# Patient Record
Sex: Female | Born: 1965 | Race: White | Hispanic: No | Marital: Married | Smoking: Current every day smoker
Health system: Southern US, Community
[De-identification: ages and names within clinical notes are randomized; demographics above are authoritative.]

## PROBLEM LIST (undated history)

## (undated) DIAGNOSIS — F419 Anxiety disorder, unspecified: Secondary | ICD-10-CM

## (undated) DIAGNOSIS — S4290XA Fracture of unspecified shoulder girdle, part unspecified, initial encounter for closed fracture: Secondary | ICD-10-CM

## (undated) DIAGNOSIS — Z8619 Personal history of other infectious and parasitic diseases: Secondary | ICD-10-CM

## (undated) HISTORY — DX: Anxiety disorder, unspecified: F41.9

## (undated) HISTORY — DX: Fracture of unspecified shoulder girdle, part unspecified, initial encounter for closed fracture: S42.90XA

## (undated) HISTORY — DX: Personal history of other infectious and parasitic diseases: Z86.19

---

## 1988-04-10 HISTORY — PX: DILATION AND CURETTAGE OF UTERUS: SHX78

## 1998-11-18 ENCOUNTER — Ambulatory Visit (HOSPITAL_COMMUNITY): Admission: RE | Admit: 1998-11-18 | Discharge: 1998-11-18 | Payer: Self-pay

## 1998-12-14 ENCOUNTER — Emergency Department (HOSPITAL_COMMUNITY): Admission: EM | Admit: 1998-12-14 | Discharge: 1998-12-14 | Payer: Self-pay | Admitting: Emergency Medicine

## 1999-01-12 ENCOUNTER — Encounter: Admission: RE | Admit: 1999-01-12 | Discharge: 1999-04-12 | Payer: Self-pay | Admitting: Specialist

## 1999-03-31 ENCOUNTER — Ambulatory Visit (HOSPITAL_COMMUNITY): Admission: RE | Admit: 1999-03-31 | Discharge: 1999-03-31 | Payer: Self-pay | Admitting: Specialist

## 1999-03-31 ENCOUNTER — Encounter: Payer: Self-pay | Admitting: Specialist

## 1999-05-30 ENCOUNTER — Ambulatory Visit (HOSPITAL_COMMUNITY): Admission: RE | Admit: 1999-05-30 | Discharge: 1999-05-30 | Payer: Self-pay | Admitting: Specialist

## 1999-05-30 ENCOUNTER — Encounter: Payer: Self-pay | Admitting: Specialist

## 1999-08-09 ENCOUNTER — Ambulatory Visit (HOSPITAL_COMMUNITY): Admission: RE | Admit: 1999-08-09 | Discharge: 1999-08-09 | Payer: Self-pay | Admitting: Specialist

## 1999-08-09 ENCOUNTER — Encounter: Payer: Self-pay | Admitting: Specialist

## 2001-01-21 ENCOUNTER — Ambulatory Visit (HOSPITAL_COMMUNITY): Admission: RE | Admit: 2001-01-21 | Discharge: 2001-01-21 | Payer: Self-pay | Admitting: Specialist

## 2001-01-21 ENCOUNTER — Encounter: Payer: Self-pay | Admitting: Specialist

## 2001-02-01 ENCOUNTER — Encounter: Payer: Self-pay | Admitting: Specialist

## 2001-02-01 ENCOUNTER — Encounter: Admission: RE | Admit: 2001-02-01 | Discharge: 2001-02-01 | Payer: Self-pay | Admitting: Specialist

## 2001-04-10 DIAGNOSIS — S4290XA Fracture of unspecified shoulder girdle, part unspecified, initial encounter for closed fracture: Secondary | ICD-10-CM

## 2001-04-10 HISTORY — DX: Fracture of unspecified shoulder girdle, part unspecified, initial encounter for closed fracture: S42.90XA

## 2002-02-25 ENCOUNTER — Other Ambulatory Visit: Admission: RE | Admit: 2002-02-25 | Discharge: 2002-02-25 | Payer: Self-pay | Admitting: *Deleted

## 2003-05-26 ENCOUNTER — Emergency Department (HOSPITAL_COMMUNITY): Admission: EM | Admit: 2003-05-26 | Discharge: 2003-05-26 | Payer: Self-pay | Admitting: Family Medicine

## 2009-07-20 ENCOUNTER — Emergency Department (HOSPITAL_COMMUNITY): Admission: EM | Admit: 2009-07-20 | Discharge: 2009-07-20 | Payer: Self-pay | Admitting: Family Medicine

## 2011-11-02 ENCOUNTER — Other Ambulatory Visit: Payer: Self-pay | Admitting: Family

## 2011-11-02 ENCOUNTER — Encounter: Payer: Self-pay | Admitting: Family

## 2011-11-02 ENCOUNTER — Ambulatory Visit (INDEPENDENT_AMBULATORY_CARE_PROVIDER_SITE_OTHER): Payer: BC Managed Care – PPO | Admitting: Family

## 2011-11-02 ENCOUNTER — Other Ambulatory Visit (HOSPITAL_COMMUNITY)
Admission: RE | Admit: 2011-11-02 | Discharge: 2011-11-02 | Disposition: A | Discharge status: Home / Self Care | Payer: BC Managed Care – PPO | Source: Ambulatory Visit | Attending: Family | Admitting: Family

## 2011-11-02 VITALS — BP 136/86 | HR 110 | Temp 98.0°F | Resp 16 | Ht 65.0 in | Wt 137.1 lb

## 2011-11-02 DIAGNOSIS — F17200 Nicotine dependence, unspecified, uncomplicated: Secondary | ICD-10-CM

## 2011-11-02 DIAGNOSIS — Z01419 Encounter for gynecological examination (general) (routine) without abnormal findings: Secondary | ICD-10-CM | POA: Insufficient documentation

## 2011-11-02 DIAGNOSIS — Z87898 Personal history of other specified conditions: Secondary | ICD-10-CM

## 2011-11-02 DIAGNOSIS — Z720 Tobacco use: Secondary | ICD-10-CM

## 2011-11-02 DIAGNOSIS — Z0000 Encounter for general adult medical examination without abnormal findings: Secondary | ICD-10-CM

## 2011-11-02 DIAGNOSIS — Z1231 Encounter for screening mammogram for malignant neoplasm of breast: Secondary | ICD-10-CM

## 2011-11-02 LAB — CBC WITH DIFFERENTIAL/PLATELET
Basophils Relative: 0 % (ref 0–1)
Eosinophils Absolute: 0.1 10*3/uL (ref 0.0–0.7)
Eosinophils Relative: 1 % (ref 0–5)
Lymphs Abs: 1.9 10*3/uL (ref 0.7–4.0)
MCH: 34.2 pg — ABNORMAL HIGH (ref 26.0–34.0)
MCHC: 35.7 g/dL (ref 30.0–36.0)
MCV: 95.8 fL (ref 78.0–100.0)
Neutrophils Relative %: 66 % (ref 43–77)
Platelets: 269 10*3/uL (ref 150–400)

## 2011-11-02 LAB — LIPID PANEL
Cholesterol: 224 mg/dL — ABNORMAL HIGH (ref 0–200)
HDL: 81 mg/dL (ref >39)
Total CHOL/HDL Ratio: 2.8 Ratio

## 2011-11-02 LAB — HEPATIC FUNCTION PANEL
AST: 17 U/L (ref 0–37)
Albumin: 4.6 g/dL (ref 3.5–5.2)
Alkaline Phosphatase: 67 U/L (ref 39–117)
Total Bilirubin: 0.4 mg/dL (ref 0.3–1.2)
Total Protein: 7.3 g/dL (ref 6.0–8.3)

## 2011-11-02 MED ORDER — VARENICLINE TARTRATE 0.5 MG X 11 & 1 MG X 42 PO MISC: ORAL | Status: AC

## 2011-11-02 NOTE — Patient Instructions (Addendum)
Please complete your lab work prior to leaving. We will mail you the results of your lab work and pap smear. Schedule mammogram on the first floor. Good luck quitting smoking! Welcome to [facility]!

## 2011-11-02 NOTE — Progress Notes (Signed)
Subjective:    Patient ID: Kimberly Mcneil, female    DOB: June 03, 1965, 46 y.o.   MRN: [id number]  HPI  Preventative- last pap was 10-11 yrs ago.  Mammogram- never.  Pt is fasting today.  She reports that she is active in her job but no formal exercise.   Tobacco abuse- smoked x 30 yrs.  She has never tried to quit.   Heel spur, fatigue.  Has been taking caffeine.  Works 12 hrs a day.  Vision is worse with driving reading.   Review of Systems  Constitutional: Negative for unexpected weight change.  HENT: Negative for hearing loss and congestion.   Eyes:       Notes some blurred vision.  Respiratory:       Smokers cough  Cardiovascular:       Notes occasional chest pain with anxiety- resolves on own.  Gastrointestinal: Negative for nausea, vomiting and diarrhea.  Genitourinary: Negative for dysuria, frequency and menstrual problem.  Musculoskeletal: Positive for arthralgias. Negative for myalgias.       Knees, elbows  Neurological: Positive for headaches.       Reports headaches 2-3 times a day  Psychiatric/Behavioral:       Denies depression   Past Medical History  Diagnosis Date  . History of chicken pox   . Broken shoulder 2003    left. Due to injury at work    History   Social History  . Marital Status: Married    Spouse Name: N/A    Number of Children: 0  . Years of Education: N/A   Occupational History  .  Ups   Social History Main Topics  . Smoking status: Current Everyday Smoker -- 1.5 packs/day for 30 years    Types: Cigarettes  . Smokeless tobacco: Never Used  . Alcohol Use: 6.0 oz/week    10 Cans of beer per week  . Drug Use: Not on file  . Sexually Active: Not on file   Other Topics Concern  . Not on file   Social History Narrative   Regular exercise:  Active work [employer] use:  2-3 sodas Lobbyist deliveryCompleted 12th gradeNo children4 dogsEnjoys beach/mountains, reading    Past Surgical History  Procedure Date  . Dilation  and curettage of uterus 1990    Family History  Problem Relation Age of Onset  . Hypertension Mother   . Dementia Mother     started in her 67's, died at age 73  . Diabetes Mother   . Cancer Father     malignant melanoma- 58  . Cancer Maternal Grandmother     breast cancer- age unknown  . Arthritis Maternal Grandfather   . Heart disease Paternal Grandfather     No Known Allergies  Current Outpatient Prescriptions on File Prior to Visit  Medication Sig Dispense Refill  . Calcium Carbonate-Vitamin D (CALCIUM-D) 600-400 MG-UNIT TABS Take 1 tablet by mouth 2 (two) times daily.        BP 136/86  Pulse 110  Temp 98 F (36.7 C) (Oral)  Resp 16  Ht 5\' 5"  (1.651 m)  Wt 137 lb 1.9 oz (62.197 kg)  BMI 22.82 kg/m2  SpO2 99%  LMP 10/26/2011       Objective:   Physical Exam Physical Exam  Constitutional: She is oriented to person, place, and time. She appears well-developed and well-nourished. No distress.  HENT:  Head: Normocephalic and atraumatic.  Right Ear: Tympanic membrane and ear canal normal.  Left Ear:  Tympanic membrane and ear canal normal.  Mouth/Throat: Oropharynx is clear and moist.  Eyes: Pupils are equal, round, and reactive to light. No scleral icterus.  Neck: Normal range of motion. No thyromegaly present.  Cardiovascular: Normal rate and regular rhythm.   No murmur heard. Pulmonary/Chest: Effort normal and breath sounds normal. No respiratory distress. He has no wheezes. She has no rales. She exhibits no tenderness.  Abdominal: Soft. Bowel sounds are normal. He exhibits no distension and no mass. There is no tenderness. There is no rebound and no guarding.  Musculoskeletal: She exhibits no edema.  Lymphadenopathy:    She has no cervical adenopathy.  Neurological: She is alert and oriented to person, place, and time. She has normal reflexes. She exhibits normal muscle tone. Coordination normal.  Skin: Skin is warm and dry.  Psychiatric: She has a normal  mood and affect. Her behavior is normal. Judgment and thought content normal.  Breasts: Examined lying Right: Without masses, retractions, discharge or axillary adenopathy.  Left: Without masses, retractions, discharge or axillary adenopathy.  Inguinal/mons: Normal without inguinal adenopathy  External genitalia: Normal  BUS/Urethra/Skene's glands: Normal  Bladder: Normal  Vagina: Normal  Cervix: Normal (pap performed with chaperone) Uterus: normal in size, shape and contour. Midline and mobile  Adnexa/parametria:  Rt: Without masses or tenderness.  Lt: Without masses or tenderness.  Anus and perineum: Normal           Assessment & Plan:          Assessment & Plan:

## 2011-11-03 LAB — BASIC METABOLIC PANEL WITH GFR
BUN: 15 mg/dL (ref 6–23)
CO2: 27 mEq/L (ref 19–32)
Calcium: 10.5 mg/dL (ref 8.4–10.5)
Chloride: 105 mEq/L (ref 96–112)
Creat: 0.62 mg/dL (ref 0.50–1.10)
GFR, Est African American: >89 mL/min
GFR, Est Non African American: >89 mL/min
Glucose, Bld: 94 mg/dL (ref 70–99)
Potassium: 4.7 mEq/L (ref 3.5–5.3)
Sodium: 139 mEq/L (ref 135–145)

## 2011-11-03 LAB — URINALYSIS, ROUTINE W REFLEX MICROSCOPIC
Glucose, UA: NEGATIVE mg/dL
Leukocytes, UA: NEGATIVE
Nitrite: NEGATIVE
Specific Gravity, Urine: 1.021 (ref 1.005–1.030)
pH: 5.5 (ref 5.0–8.0)

## 2011-11-03 LAB — URINALYSIS, MICROSCOPIC ONLY
Casts: NONE SEEN
Crystals: NONE SEEN

## 2011-11-03 LAB — TSH: TSH: 2.305 u[IU]/mL (ref 0.350–4.500)

## 2011-11-08 ENCOUNTER — Telehealth: Payer: Self-pay | Admitting: Family

## 2011-11-08 DIAGNOSIS — Z0000 Encounter for general adult medical examination without abnormal findings: Secondary | ICD-10-CM | POA: Insufficient documentation

## 2011-11-08 DIAGNOSIS — Z720 Tobacco use: Secondary | ICD-10-CM | POA: Insufficient documentation

## 2011-11-08 NOTE — Telephone Encounter (Signed)
Pls let pt know that her pap smear looks ok, but we did not get a good sampling of the cells from her cervix.  I would like to repeat her pap in 6 months. Thyroid and liver look good.  Looks like she may have been a little dehydrated that day.  Make sure she is drinking 6-8 glasses of water a day.  Cholesterol is mildly elevated. She should work on a low fat/low cholesterol diet and exercise.

## 2011-11-08 NOTE — Assessment & Plan Note (Signed)
Pt counseled on risks of smoking and on smoking cessation x 5 minutes.  She would like to start chantix.  We discussed common side effects and risks of chantix including black box warning of suicide ideation.  Pt verbalizes understanding.

## 2011-11-08 NOTE — Assessment & Plan Note (Addendum)
Pt counseled on healthy diet, exercise.  Pap performed today, refer for mammogram. Immunizations reviewed and up to date.

## 2011-11-09 NOTE — Telephone Encounter (Signed)
Notified pt of results below. Pt states she will wait to repeat pap smear in 1 year. Advised pt that recommendation is to repeat in 6 months as result is not definitive from the cervix. Pt voices understanding but still wishes to repeat in 1 year.

## 2011-11-29 ENCOUNTER — Ambulatory Visit (HOSPITAL_BASED_OUTPATIENT_CLINIC_OR_DEPARTMENT_OTHER)
Admission: RE | Admit: 2011-11-29 | Discharge: 2011-11-29 | Disposition: A | Discharge status: Home / Self Care | Payer: BC Managed Care – PPO | Source: Ambulatory Visit | Attending: Family | Admitting: Family

## 2011-11-29 DIAGNOSIS — Z1231 Encounter for screening mammogram for malignant neoplasm of breast: Secondary | ICD-10-CM | POA: Insufficient documentation

## 2011-12-04 ENCOUNTER — Ambulatory Visit (HOSPITAL_BASED_OUTPATIENT_CLINIC_OR_DEPARTMENT_OTHER): Payer: BC Managed Care – PPO

## 2011-12-04 ENCOUNTER — Inpatient Hospital Stay (HOSPITAL_BASED_OUTPATIENT_CLINIC_OR_DEPARTMENT_OTHER): Admission: RE | Admit: 2011-12-04 | Payer: BC Managed Care – PPO | Source: Ambulatory Visit

## 2012-03-02 ENCOUNTER — Encounter (HOSPITAL_COMMUNITY): Payer: Self-pay | Admitting: Emergency Medicine

## 2012-03-02 ENCOUNTER — Emergency Department (HOSPITAL_COMMUNITY)
Admission: EM | Admit: 2012-03-02 | Discharge: 2012-03-02 | Disposition: A | Discharge status: Home / Self Care | Payer: BC Managed Care – PPO | Attending: Emergency Medicine | Admitting: Emergency Medicine

## 2012-03-02 DIAGNOSIS — Z87828 Personal history of other (healed) physical injury and trauma: Secondary | ICD-10-CM | POA: Insufficient documentation

## 2012-03-02 DIAGNOSIS — S058X9A Other injuries of unspecified eye and orbit, initial encounter: Secondary | ICD-10-CM | POA: Insufficient documentation

## 2012-03-02 DIAGNOSIS — H539 Unspecified visual disturbance: Secondary | ICD-10-CM | POA: Insufficient documentation

## 2012-03-02 DIAGNOSIS — H5789 Other specified disorders of eye and adnexa: Secondary | ICD-10-CM | POA: Insufficient documentation

## 2012-03-02 DIAGNOSIS — Z8619 Personal history of other infectious and parasitic diseases: Secondary | ICD-10-CM | POA: Insufficient documentation

## 2012-03-02 DIAGNOSIS — F17200 Nicotine dependence, unspecified, uncomplicated: Secondary | ICD-10-CM | POA: Insufficient documentation

## 2012-03-02 DIAGNOSIS — Y929 Unspecified place or not applicable: Secondary | ICD-10-CM | POA: Insufficient documentation

## 2012-03-02 DIAGNOSIS — H53149 Visual discomfort, unspecified: Secondary | ICD-10-CM | POA: Insufficient documentation

## 2012-03-02 DIAGNOSIS — Y9389 Activity, other specified: Secondary | ICD-10-CM | POA: Insufficient documentation

## 2012-03-02 DIAGNOSIS — S0500XA Injury of conjunctiva and corneal abrasion without foreign body, unspecified eye, initial encounter: Secondary | ICD-10-CM

## 2012-03-02 DIAGNOSIS — X58XXXA Exposure to other specified factors, initial encounter: Secondary | ICD-10-CM | POA: Insufficient documentation

## 2012-03-02 MED ORDER — HYDROCODONE-ACETAMINOPHEN 5-325 MG PO TABS
2.0000 | ORAL_TABLET | Freq: Once | ORAL | Status: AC
  Administered 2012-03-02: 2 via ORAL
  Filled 2012-03-02: qty 2

## 2012-03-02 MED ORDER — FLUORESCEIN SODIUM 1 MG OP STRP
1.0000 | ORAL_STRIP | Freq: Once | OPHTHALMIC | Status: AC
  Administered 2012-03-02: 1 via OPHTHALMIC
  Filled 2012-03-02: qty 1

## 2012-03-02 MED ORDER — TETRACAINE HCL 0.5 % OP SOLN
2.0000 [drp] | Freq: Once | OPHTHALMIC | Status: AC
  Administered 2012-03-02: 2 [drp] via OPHTHALMIC
  Filled 2012-03-02: qty 2

## 2012-03-02 MED ORDER — HYDROCODONE-ACETAMINOPHEN 10-325 MG PO TABS: 1.0000 | ORAL_TABLET | Freq: Four times a day (QID) | ORAL | Status: DC | PRN

## 2012-03-02 NOTE — ED Provider Notes (Signed)
History     CSN: [id number]  Arrival date & time 03/02/12  2135   First MD Initiated Contact with Patient 03/02/12 2158      Chief Complaint  Patient presents with  . Eye Pain   HPI  History provided by the patient and husband. Patient is a 46 year old female with no significant PMH who presents with complaints of worsened left eye pain and irritation. Patient states she is having some eye irritation problems for the past one to 2 weeks. She was initially told she had a staph infection and was put on eyedrops. Patient took these as instructed for several days and has had frequent rechecks for symptoms. She has some improvements at times but then had some worsening of pain redness. She was given additional eyedrop medications as well as prescription for by mouth doxycycline. She has been taking these for the past 3 days. She had one day of improvement but 2 days ago developed sharp worsening pain to the eye. Pain is persistent and worse with blinking and bright lights. Patient does report using full racing continually wiping at her eye frequently. She states symptoms feel like sharp razors in her eye. She denies any fever, chills or sweats.    Past Medical History  Diagnosis Date  . History of chicken pox   . Broken shoulder 2003    left. Due to injury at work    Past Surgical History  Procedure Date  . Dilation and curettage of uterus 1990    Family History  Problem Relation Age of Onset  . Hypertension Mother   . Dementia Mother     started in her 95's, died at age 3  . Diabetes Mother   . Cancer Father     malignant melanoma- 15  . Cancer Maternal Grandmother     breast cancer- age unknown  . Arthritis Maternal Grandfather   . Heart disease Paternal Grandfather     History  Substance Use Topics  . Smoking status: Current Every Day Smoker -- 1.5 packs/day for 30 years    Types: Cigarettes  . Smokeless tobacco: Never Used  . Alcohol Use: 6.0 oz/week    10 Cans of  beer per week    OB History    Grav Para Term Preterm Abortions TAB SAB Ect Mult Living   1 0 0 0 1           Review of Systems  Constitutional: Negative for fever, chills and diaphoresis.  Eyes: Positive for photophobia, pain, redness and visual disturbance.  Gastrointestinal: Negative for nausea and vomiting.  Neurological: Negative for dizziness and headaches.  All other systems reviewed and are negative.    Allergies  Review of patient's allergies indicates no known allergies.  Home Medications   Current Outpatient Rx  Name  Route  Sig  Dispense  Refill  . BESIFLOXACIN HCL 0.6 % OP SUSP   Left Eye   Place 1 drop into the left eye every 2 (two) hours. For 21 days, filled 02/28/2012         . CALCIUM-D 600-400 MG-UNIT PO TABS   Oral   Take 1 tablet by mouth 2 (two) times daily.         Marland Kitchen DOXYCYCLINE HYCLATE 100 MG PO CAPS   Oral   Take 100 mg by mouth 2 (two) times daily. Filled 02/28/12 has taken 8 of 42 doses         . HOMATROPINE HBR 5 % OP SOLN  Left Eye   Place 1 drop into the left eye 3 (three) times daily.         . IBUPROFEN 200 MG PO TABS   Oral   Take 800 mg by mouth 2 (two) times daily as needed. For pain         . POLYMYXIN B-TRIMETHOPRIM 10000-0.1 UNIT/ML-% OP SOLN   Left Eye   Place 1 drop into the left eye every 2 (two) hours. For 21 days, filled 02/28/2012           BP 141/98  Pulse 94  Temp 98 F (36.7 C) (Oral)  Resp 20  SpO2 98%  LMP 02/19/2012  Physical Exam  Nursing note and vitals reviewed. Constitutional: She is oriented to person, place, and time. She appears well-developed and well-nourished. No distress.  HENT:  Head: Normocephalic.  Eyes: Lids are normal. Left eye exhibits no chemosis. No foreign body present in the left eye. Left conjunctiva is injected. Left conjunctiva has no hemorrhage.  Slit lamp exam:      The left eye shows corneal abrasion, corneal ulcer and fluorescein uptake. The left eye shows no  foreign body and no hyphema.         There is a inferior corneal ulcer and abrasion on the left side.   Cardiovascular: Normal rate and regular rhythm.   Pulmonary/Chest: Effort normal and breath sounds normal.  Neurological: She is alert and oriented to person, place, and time.  Skin: Skin is warm and dry.  Psychiatric: She has a normal mood and affect. Her behavior is normal.    ED Course  Procedures      1. Corneal abrasion       MDM  10:15 PM patient seen and evaluated. Patient appears uncomfortable in no acute distress.  Findings consistent with small abrasion and ulceration to the inferior left cornea. Patient is currently on antibacterial eyedrops. She has good ophthalmology followup. She is also taking oral antibiotics with doxycycline. At this time will help with comfort by prescribing Norco.        Angus Seller, PA 03/02/12 2324

## 2012-03-02 NOTE — ED Notes (Signed)
Pt d/c home in NAD. Pt voiced many questions to PA and nurse, stated she had no furthur questions. Medications discussed, as well as possible side effects. Pt instructed not to drive after taking medication.

## 2012-03-02 NOTE — ED Notes (Signed)
Pt. Presents to ED with eye pain.  Pt is being seen by an optometrist for viral staff infection.  Pt. Had seen physician again for repeated pain and irritation; however, the medication, as stated by the pt., is not working and has increased pain.  Pt has redness to the left eye, no obvious swelling and feels like "razors" are in the eye.

## 2012-03-03 NOTE — ED Provider Notes (Signed)
Medical screening examination/treatment/procedure(s) were performed by non-physician practitioner and as supervising physician I was immediately available for consultation/collaboration.  Celene Kras, MD 03/03/12 [phone]

## 2012-10-29 ENCOUNTER — Encounter (HOSPITAL_COMMUNITY): Payer: Self-pay | Admitting: *Deleted

## 2012-10-29 ENCOUNTER — Emergency Department (INDEPENDENT_AMBULATORY_CARE_PROVIDER_SITE_OTHER)
Admission: EM | Admit: 2012-10-29 | Discharge: 2012-10-29 | Disposition: A | Discharge status: Home / Self Care | Payer: BC Managed Care – PPO | Source: Home / Self Care | Attending: Family Medicine | Admitting: Family Medicine

## 2012-10-29 ENCOUNTER — Emergency Department (INDEPENDENT_AMBULATORY_CARE_PROVIDER_SITE_OTHER): Payer: BC Managed Care – PPO

## 2012-10-29 DIAGNOSIS — S93601A Unspecified sprain of right foot, initial encounter: Secondary | ICD-10-CM

## 2012-10-29 DIAGNOSIS — S93609A Unspecified sprain of unspecified foot, initial encounter: Secondary | ICD-10-CM

## 2012-10-29 MED ORDER — DICLOFENAC POTASSIUM 50 MG PO TABS: 50.0000 mg | ORAL_TABLET | Freq: Three times a day (TID) | ORAL | Status: DC

## 2012-10-29 NOTE — ED Provider Notes (Signed)
History    CSN: [id number] Arrival date & time 10/29/12  1335  First MD Initiated Contact with Patient 10/29/12 1358     Chief Complaint  Patient presents with  . Foot Pain   (Consider location/radiation/quality/duration/timing/severity/associated sxs/prior Treatment) Patient is a 47 y.o. female presenting with lower extremity pain. The history is provided by the patient.  Foot Pain This is a new problem. The current episode started more than 1 week ago (twisted right foot). The problem has been gradually worsening. The symptoms are aggravated by walking.   Past Medical History  Diagnosis Date  . History of chicken pox   . Broken shoulder 2003    left. Due to injury at work   Past Surgical History  Procedure Laterality Date  . Dilation and curettage of uterus  1990   Family History  Problem Relation Age of Onset  . Hypertension Mother   . Dementia Mother     started in her 75's, died at age 60  . Diabetes Mother   . Cancer Father     malignant melanoma- 39  . Cancer Maternal Grandmother     breast cancer- age unknown  . Arthritis Maternal Grandfather   . Heart disease Paternal Grandfather    History  Substance Use Topics  . Smoking status: Current Every Day Smoker -- 1.50 packs/day for 30 years    Types: Cigarettes  . Smokeless tobacco: Never Used  . Alcohol Use: 6.0 oz/week    10 Cans of beer per week   OB History   Grav Para Term Preterm Abortions TAB SAB Ect Mult Living   1 0 0 0 1          Review of Systems  Constitutional: Negative.   Musculoskeletal: Positive for joint swelling and gait problem. Negative for myalgias.  Skin: Negative.     Allergies  Review of patient's allergies indicates no known allergies.  Home Medications   Current Outpatient Rx  Name  Route  Sig  Dispense  Refill  . Besifloxacin HCl (BESIVANCE) 0.6 % SUSP   Left Eye   Place 1 drop into the left eye every 2 (two) hours. For 21 days, filled 02/28/2012         . Calcium  Carbonate-Vitamin D (CALCIUM-D) 600-400 MG-UNIT TABS   Oral   Take 1 tablet by mouth 2 (two) times daily.         . diclofenac (CATAFLAM) 50 MG tablet   Oral   Take 1 tablet (50 mg total) by mouth 3 (three) times daily.   30 tablet   0   . doxycycline (VIBRAMYCIN) 100 MG capsule   Oral   Take 100 mg by mouth 2 (two) times daily. Filled 02/28/12 has taken 8 of 42 doses         . homatropine 5 % ophthalmic solution   Left Eye   Place 1 drop into the left eye 3 (three) times daily.         Marland Kitchen HYDROcodone-acetaminophen (NORCO) 10-325 MG per tablet   Oral   Take 1 tablet by mouth every 6 (six) hours as needed for pain.   20 tablet   0   . ibuprofen (ADVIL,MOTRIN) 200 MG tablet   Oral   Take 800 mg by mouth 2 (two) times daily as needed. For pain         . trimethoprim-polymyxin b (POLYTRIM) ophthalmic solution   Left Eye   Place 1 drop into the left eye every  2 (two) hours. For 21 days, filled 02/28/2012          BP 130/86  Pulse 112  Temp(Src) 98.6 F (37 C) (Oral)  Resp 18  SpO2 94%  LMP 10/20/2012 Physical Exam  Nursing note and vitals reviewed. Constitutional: She is oriented to person, place, and time. She appears well-developed and well-nourished.  Musculoskeletal: She exhibits tenderness.       Right foot: She exhibits swelling.       Feet:  Neurological: She is alert and oriented to person, place, and time.  Skin: Skin is warm and dry.    ED Course  Procedures (including critical care time) Labs Reviewed - No data to display Dg Foot Complete Right  10/29/2012   *RADIOLOGY REPORT*  Clinical Data: Fall with right foot pain.  RIGHT FOOT COMPLETE - 3+ VIEW  Comparison:  None.  Findings:  There is no evidence of fracture or dislocation.  There is no evidence of arthropathy or other focal bone abnormality. Soft tissues are unremarkable.  IMPRESSION: Negative.   Original Report Authenticated By: Irish Lack, M.D.   1. Sprain of foot, right, initial  encounter     MDM  X-rays reviewed and report per radiologist.   Linna Hoff, MD 10/29/12 [phone]

## 2012-10-29 NOTE — ED Notes (Signed)
xs  Female wooden  Shoe  Given     And  fitted

## 2012-10-29 NOTE — ED Notes (Signed)
Pt   Reports  She  Felled  10  Days  Ago  And  inj  Her  r  Foot  /  Ankle   She  Has  Pain on weight  Bearing  With  Swelling  As  Well     -  She  Reports        She  Has  Been   Working on the  Affected  Foot       And now  Has  Pain radiating up leg as  Well as  In the  Other leg as  well

## 2012-12-16 ENCOUNTER — Ambulatory Visit (INDEPENDENT_AMBULATORY_CARE_PROVIDER_SITE_OTHER): Payer: BC Managed Care – PPO | Admitting: Family Medicine

## 2012-12-16 ENCOUNTER — Telehealth: Payer: Self-pay | Admitting: Family

## 2012-12-16 ENCOUNTER — Telehealth: Payer: Self-pay | Admitting: *Deleted

## 2012-12-16 VITALS — BP 138/80 | HR 115 | Temp 99.1°F | Resp 19 | Ht 64.0 in | Wt 127.0 lb

## 2012-12-16 DIAGNOSIS — F17200 Nicotine dependence, unspecified, uncomplicated: Secondary | ICD-10-CM

## 2012-12-16 DIAGNOSIS — F411 Generalized anxiety disorder: Secondary | ICD-10-CM

## 2012-12-16 DIAGNOSIS — F419 Anxiety disorder, unspecified: Secondary | ICD-10-CM

## 2012-12-16 DIAGNOSIS — Z720 Tobacco use: Secondary | ICD-10-CM

## 2012-12-16 DIAGNOSIS — K921 Melena: Secondary | ICD-10-CM

## 2012-12-16 DIAGNOSIS — F1010 Alcohol abuse, uncomplicated: Secondary | ICD-10-CM

## 2012-12-16 LAB — POCT CBC
Granulocyte percent: 71.1 %G (ref 37–80)
Hemoglobin: 15.6 g/dL (ref 12.2–16.2)
MCV: 106.1 fL — AB (ref 80–97)
MID (cbc): 0.4 (ref 0–0.9)
MPV: 7.8 fL (ref 0–99.8)
POC MID %: 5.9 %M (ref 0–12)
Platelet Count, POC: 198 10*3/uL (ref 142–424)
RBC: 4.49 M/uL (ref 4.04–5.48)
WBC: 7.4 10*3/uL (ref 4.6–10.2)

## 2012-12-16 MED ORDER — ALPRAZOLAM 0.25 MG PO TABS
1.0000 mg | ORAL_TABLET | Freq: Once | ORAL | Status: AC
  Administered 2012-12-16: 1 mg via ORAL

## 2012-12-16 MED ORDER — ALPRAZOLAM 0.5 MG PO TABS: 0.5000 mg | ORAL_TABLET | Freq: Three times a day (TID) | ORAL | Status: DC | PRN

## 2012-12-16 NOTE — Telephone Encounter (Signed)
Patient Information:  Caller Name: Kimberly Mcneil  Phone: [phone]  Patient: Kimberly Mcneil, Kimberly Mcneil  Gender: Female  DOB: November 04, 1965  Age: 47 Years  PCP: Sandford Craze (Adults only)  Pregnant: No  Office Follow Up:  Does the office need to follow up with this patient?: Yes  Instructions For The Office: Has go to ED now  or office with PCP approval disposition due to black bowel movements. Also, Requesting anxiety medication be called in to pharmacy.   Symptoms  Reason For Call & Symptoms: Kimberly Mcneil states her husband left her 3 weeks ago.  Hetal states she was left with financial responsability for 2 house and all expense. Requesting something for nerves. Cannot focus. "Needs something to take the edge off."  Uncontrollable crying this AM. Had to take day off work. Cannot eat , sleep. Diarrhea which is black onset 2 weeks ago.. Per diarrhea protocol has go to ED now or office with PCP approval due to [facility] Bowel movements. Per depression protocol has call agency today due to symptoms > 2 weeks. PLEASE CALL Kimberly Mcneil  TODAY 12/16/12 regarding agency to call for anxiety and medication request. Advised to go to ED/UC due to black bowel movements. No appointments available in EPIC. Requesting a medication for anxiety be called in to [facility] [phone].  Reviewed Health History In EMR: Yes  Reviewed Medications In EMR: Yes  Reviewed Allergies In EMR: Yes  Reviewed Surgeries / Procedures: Yes  Date of Onset of Symptoms: 11/25/2012 OB / GYN:  LMP: 12/06/2012  Guideline(s) Used:  Depression  Diarrhea  Disposition Per Guideline:   Go to ED Now (or to Office with PCP Approval)  Reason For Disposition Reached:   Black bowel movements  Advice Given:  N/A  Patient Will Follow Care Advice:  YES

## 2012-12-16 NOTE — Patient Instructions (Signed)
You need to establish with a therapist or psychiatrist for further care.  If you see a therapist or psychologist, you should also see your regular doctor to discuss alternative medications as xanax is purely a temporary bandaid that should not be used for more than a few weeks.  Here are some #s of several of the many great places in town:  [facility]  [address], [place], [place] [address]  [phone]  [facility]  Address: [address], [place], [place] [address]  [phone]  [facility] - Dr. Allena Katz Address: [address] [place], [place] [address]  [phone]  [facility] [address] [place], [place] Phone: [phone]

## 2012-12-16 NOTE — Telephone Encounter (Signed)
Reviewed note.  Spoke with pt.  Reinforced importance of going to the ER due to black stools.  She verbalizes understanding. Requesting med to "take the edge off" of her stress.  Advised pt that we would need to evaluate her in the office for this first.  If we rx a controlled substances she would need to sign a controlled substance contract.  Judeth Cornfield, please call pt in the AM an OV.

## 2012-12-16 NOTE — Telephone Encounter (Signed)
FYI

## 2012-12-16 NOTE — Progress Notes (Signed)
Subjective:    Patient ID: Kimberly Mcneil, female    DOB: 1965/12/01, 47 y.o.   MRN: [id number] Chief Complaint  Patient presents with  . Anxiety    husband left 2 weeks ago, not eating, fatigue, loose black stools x2 weeks    HPI  Husband just up and left her 3 wks ago after 24 yrs. He was having an affair for a while and moved in with another woman. Since then pt has been having worsening anxiety, crying, no appetite x 2 wks.  Has lost some weight - about 9 lbs.  Here with her best friend Darryl who has been her only support for a while.  Feels like she is going to come out of her skin, just crying constantly. Her PCP didn't want to rx her anything until she could be seen to sign a controlled sub policy.  Wants to climb out of skin and just pace.  Has never had a panic attack prior to this but darryl has seen her break down and cry and doesn't know what to do - will happen at the spur of a moment - like she is hitting an emotional wall and just falls apart.  Each episode usually lasts for about an hr.  Panting but no CP. Brother called 911 on her as she told him on the phone that she was having an anxiety attack and couldn't breath.  Palpitations and heart racing.  Loose black stool for about 1 1/2 weeks - her PCP wanted her to be checked on that.  Diarrhea. No f/c. Might be having some stomach pains but just feels like everything aches as she is so tense and shaking.  Period was on Sat - only lasted a day instead for 3 1/2d.  No chance she could be pregnant. Had dry heaves twice but smoking a lot more so coughing a lot.  Never been on any meds for mood or anxiety.  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  Past Medical History  Diagnosis Date  . History of chicken pox   . Broken shoulder 2003    left. Due to injury at work  . Anxiety    Current Outpatient Prescriptions on File Prior to Visit  Medication Sig Dispense Refill  . Calcium Carbonate-Vitamin D (CALCIUM-D) 600-400 MG-UNIT TABS Take 1 tablet  by mouth 2 (two) times daily.      Marland Kitchen Besifloxacin HCl (BESIVANCE) 0.6 % SUSP Place 1 drop into the left eye every 2 (two) hours. For 21 days, filled 02/28/2012      . diclofenac (CATAFLAM) 50 MG tablet Take 1 tablet (50 mg total) by mouth 3 (three) times daily.  30 tablet  0  . doxycycline (VIBRAMYCIN) 100 MG capsule Take 100 mg by mouth 2 (two) times daily. Filled 02/28/12 has taken 8 of 42 doses      . homatropine 5 % ophthalmic solution Place 1 drop into the left eye 3 (three) times daily.      Marland Kitchen HYDROcodone-acetaminophen (NORCO) 10-325 MG per tablet Take 1 tablet by mouth every 6 (six) hours as needed for pain.  20 tablet  0  . ibuprofen (ADVIL,MOTRIN) 200 MG tablet Take 800 mg by mouth 2 (two) times daily as needed. For pain      . trimethoprim-polymyxin b (POLYTRIM) ophthalmic solution Place 1 drop into the left eye every 2 (two) hours. For 21 days, filled 02/28/2012       No current facility-administered medications on file prior to visit.  No Known Allergies    Review of Systems  Constitutional: Positive for diaphoresis, activity change, appetite change, fatigue and unexpected weight change. Negative for fever and chills.  Respiratory: Positive for cough and shortness of breath. Negative for chest tightness.   Cardiovascular: Positive for palpitations. Negative for chest pain and leg swelling.  Gastrointestinal: Positive for nausea, vomiting, abdominal pain and diarrhea. Negative for constipation, blood in stool, anal bleeding and rectal pain.  Musculoskeletal: Positive for myalgias, back pain and arthralgias. Negative for joint swelling and gait problem.  Psychiatric/Behavioral: Positive for behavioral problems, sleep disturbance, self-injury, dysphoric mood, decreased concentration and agitation. Negative for suicidal ideas, hallucinations and confusion. The patient is nervous/anxious. The patient is not hyperactive.        BP 138/80  Pulse 115  Temp(Src) 99.1 F (37.3 C) (Oral)   Resp 19  Ht 5\' 4"  (1.626 m)  Wt 127 lb (57.607 kg)  BMI 21.79 kg/m2  SpO2 99%  LMP 12/14/2012 Objective:   Physical Exam  Constitutional: She is oriented to person, place, and time. She appears well-developed and well-nourished. No distress.  HENT:  Head: Normocephalic and atraumatic.  Neck: Normal range of motion. Neck supple. No thyromegaly present.  Cardiovascular: Normal rate, regular rhythm, normal heart sounds and intact distal pulses.   Pulmonary/Chest: Effort normal and breath sounds normal. No respiratory distress.  Abdominal: Soft. Bowel sounds are normal. She exhibits no distension and no mass. There is no tenderness. There is no rebound, no guarding and no CVA tenderness.  Genitourinary: Rectal exam shows no internal hemorrhoid, no fissure, no mass, no tenderness and anal tone normal. Guaiac negative stool.  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  Musculoskeletal: She exhibits no edema.  Lymphadenopathy:    She has no cervical adenopathy.  Neurological: She is alert and oriented to person, place, and time.  Skin: Skin is warm and dry. She is not diaphoretic. No erythema.  Psychiatric: Her mood appears anxious. Her affect is labile. She is agitated.      Results for orders placed in visit on 12/16/12  POCT CBC      Result Value Range   WBC 7.4  4.6 - 10.2 K/uL   Lymph, poc 1.7  0.6 - 3.4   POC LYMPH PERCENT 23.0  10 - 50 %L   MID (cbc) 0.4  0 - 0.9   POC MID % 5.9  0 - 12 %M   POC Granulocyte 5.3  2 - 6.9   Granulocyte percent 71.1  37 - 80 %G   RBC 4.49  4.04 - 5.48 M/uL   Hemoglobin 15.6  12.2 - 16.2 g/dL   HCT, POC 16.1  09.6 - 47.9 %   MCV 106.1 (*) 80 - 97 fL   MCH, POC 34.7 (*) 27 - 31.2 pg   MCHC 32.8  31.8 - 35.4 g/dL   RDW, POC 04.5     Platelet Count, POC 198  142 - 424 K/uL   MPV 7.8  0 - 99.8 fL  IFOBT (OCCULT BLOOD)      Result Value Range   IFOBT Negative     Assessment & Plan:  Tobacco abuse  Anxiety - Plan: ALPRAZolam  (XANAX) tablet 1 mg po x 1 given in office as I was unable to do exam due to panic attack - 30 min after that pt felt much better and able to let me do a physical - f/u w/ PCP and therapist or psychiatrist for future  medications - repeatedly reinforced that alprazolam is a temporary measure to allow pt to get over shock and get stablized - will likely need more permanent solution such as therapy and ssri.  Pt concerned that she cannot do therapy due to work but reinforced that she will need to find a place w/ alternative hrs or use FMLA as going w/o mental health support at this time is not an option considering her severe panic which she is abusing tobacco and alcohol to self-medicate with.  Melena - Plan: POCT CBC, IFOBT POC (occult bld, rslt in office) - unsure of etiology of stools but hgb nml and no blood in stool in office tonight. Likely due to diet changes, f/u w/ PCP  Alcohol abuse - drinking 12 beers/night for months at least - needs to stop completely - esp while on xanax. Pt agreeable but will likely need help and continuing therapy to maintain cessation.  Meds ordered this encounter  Medications  . ALPRAZolam (XANAX) tablet 1 mg    Sig:   . ALPRAZolam (XANAX) 0.5 MG tablet    Sig: Take 1 tablet (0.5 mg total) by mouth 3 (three) times daily as needed for sleep or anxiety. Do not take within 8 hrs of working or driving.    Dispense:  30 tablet    Refill:  0   Over 40 min spent in face to face evaluation of pt.

## 2012-12-17 NOTE — Telephone Encounter (Signed)
Left message on both home and cell for patient to return my call.

## 2012-12-18 NOTE — Telephone Encounter (Signed)
No need to call pt. Thanks.

## 2012-12-18 NOTE — Telephone Encounter (Signed)
Left message for patient to return my call.   Also, I noticed in patients chart that she went to UC on 12/16/12 for these problems and was given a prescription for alprazolam. Melissa, do you want me to continue calling this patient for an appointment?

## 2013-02-04 NOTE — Telephone Encounter (Signed)
Wrong chart - not a pt at [facility].

## 2014-06-10 ENCOUNTER — Encounter: Payer: Self-pay | Admitting: Primary Care

## 2014-06-10 ENCOUNTER — Ambulatory Visit (INDEPENDENT_AMBULATORY_CARE_PROVIDER_SITE_OTHER): Payer: BLUE CROSS/BLUE SHIELD | Admitting: Primary Care

## 2014-06-10 ENCOUNTER — Other Ambulatory Visit: Payer: Self-pay | Admitting: Primary Care

## 2014-06-10 VITALS — BP 122/86 | HR 97 | Temp 98.0°F | Resp 18 | Ht 64.0 in | Wt 123.8 lb

## 2014-06-10 DIAGNOSIS — N644 Mastodynia: Secondary | ICD-10-CM

## 2014-06-10 DIAGNOSIS — R059 Cough, unspecified: Secondary | ICD-10-CM | POA: Insufficient documentation

## 2014-06-10 DIAGNOSIS — R05 Cough: Secondary | ICD-10-CM | POA: Insufficient documentation

## 2014-06-10 DIAGNOSIS — Z720 Tobacco use: Secondary | ICD-10-CM

## 2014-06-10 MED ORDER — BENZONATATE 100 MG PO CAPS: 100.0000 mg | ORAL_CAPSULE | Freq: Three times a day (TID) | ORAL | Status: AC | PRN

## 2014-06-10 NOTE — Assessment & Plan Note (Signed)
Suspect viral in nature. Tessalon Pearls given to help ease cough.

## 2014-06-10 NOTE — Assessment & Plan Note (Addendum)
Suspect this is likely MSK related.  Ordered bilateral diagnostic mammogram due to complaint, positive smoking history, and family history of cancer. Last mammogram was in 2013 (negative for malignancy) Ibuprofen for pain. Encouraged smoking cessation.

## 2014-06-10 NOTE — Patient Instructions (Signed)
You will be contacted regarding your mammogram. Please call me if you've not heard back within one week. Take the Tessalon Pearls three times daily as need for cough. You may take ibuprofen as needed for pain and inflammation of chest wall. Call me if you develop severe shortness of breath. Try Nicotine Gum to help with smoking cessation, which can be purchased over the counter. Read and follow package instructions.

## 2014-06-10 NOTE — Assessment & Plan Note (Signed)
Spent some time discussing the importance of smoking cessation, especially given her family history of cancer. She is open to quitting and would like to try the gum but is not open to medications at this time. Encouraged the gum.

## 2014-06-10 NOTE — Progress Notes (Signed)
Pre visit review using our clinic review tool, if applicable. No additional management support is needed unless otherwise documented below in the visit note. 

## 2014-06-10 NOTE — Progress Notes (Signed)
Subjective:    Patient ID: Kimberly Fordyceamela Mcneil, female    DOB: 01-Apr-1966, 49 y.o.   MRN: [id number]  HPI  Kimberly Mcneil is a 49 year old female who presents today with a chief complaint of sore, aching, pain located in the right breast that has been present since Friday last week. She reports the pain is worse with pressure such as coughing or taking a deep breath. The pain starts at the top of her axilla and radiates down into her right nipple. She has not noticed any masses, changes in the appearance of her skin, discharge from her nipple, chills, fevers. She has been taking ibuprofen 800mg  twice daily since yesterday which has helped to reduce the pain. Denies pain, masses, skin changes to left breast.  She also reports being diagnosed with a sinus infection and cough several weeks ago and was treated with antibotics and has now developed a non productive cough that has been present for 10 days. She denies fever, chills, body aches and has been smoking since she was 16. She was given some cough medication at Urgent Care which made her drowsy so was unable to take very often. Nothing makes cough worse.  Last Mammogram 11/2011. She reports a family history of cancer (breast in maternal grandmother and malignant melanoma in her father).  Also states that her mother had cysts in her breasts, no cancer. LMP 06/05/2014 and was normal.  She is not on oral contraceptives or hormone replacement therapy. Denies a personal history of cysts and does not do self breast exams at home.   Review of Systems  Constitutional: Negative for fever and chills.  HENT: Positive for rhinorrhea and sinus pressure. Negative for ear pain and sore throat.        Resolving sinus infection  Respiratory: Positive for cough and chest tightness. Negative for shortness of breath.   Cardiovascular: Positive for chest pain.       With deep inspiration and pressure such as cough. Denies nausea, diaphoresis, radiation of pain.    Gastrointestinal: Negative for nausea and vomiting.  Genitourinary: Negative for vaginal bleeding and menstrual problem.  Skin: Negative for rash.  Neurological: Negative for dizziness and headaches.       Prior headache from sinus infection which is resolving.       Past Medical History  Diagnosis Date  . History of chicken pox   . Broken shoulder 2003    left. Due to injury at work  . Anxiety     History   Social History  . Marital Status: Married    Spouse Name: N/A  . Number of Children: 0  . Years of Education: N/A   Occupational History  .  U[employer]   Social History Main Topics  . Smoking status: Current Every Day Smoker -- 1.50 packs/day for 30 years    Types: Cigarettes  . Smokeless tobacco: Never Used  . Alcohol Use: 6.0 oz/week    10 Cans of beer per week  . Drug Use: No  . Sexual Activity: Not on file   Other Topics Concern  . 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 Dilation and  curettage of uterus  1990    Family History  Problem Relation Age of Onset  . Hypertension Mother   . Dementia Mother     started in her 52's, died at age 2  . Diabetes Mother   . Cancer Father     malignant melanoma- 81  . Cancer Maternal Grandmother     breast cancer- age unknown  . Arthritis Maternal Grandfather   . Heart disease Paternal Grandfather     No Known Allergies  Current Outpatient Prescriptions on File Prior to Visit  Medication Sig Dispense Refill  . Calcium Carbonate-Vitamin D (CALCIUM-D) 600-400 MG-UNIT TABS Take 1 tablet by mouth 2 (two) times daily.     No current facility-administered medications on file prior to visit.    BP 122/86 mmHg  Pulse 97  Temp(Src) 98 F (36.7 C) (Oral)  Resp 18  Ht 5'  4" (1.626 m)  Wt 123 lb 12.8 oz (56.155 kg)  BMI 21.24 kg/m2  SpO2 99%  LMP 06/08/2014    Objective:   Physical Exam  Constitutional: She is oriented to person, place, and time. She appears well-developed.  HENT:  Head: Normocephalic.  Left Ear: External ear normal.  Mouth/Throat: Oropharynx is clear and moist.  Eyes: Conjunctivae are normal. Pupils are equal, round, and reactive to light.  Neck: Neck supple.  Cardiovascular: Normal rate and regular rhythm.   Pulmonary/Chest: Effort normal. She has rales. She exhibits tenderness.  Faint rales to RUL. Reproducible pain to right breast proximal to acerola. No other chest wall tenderness noted.  Genitourinary: There is breast tenderness.  No masses, skin appears normal without change in texture bilaterally, no erythema. Tender upon palpation to right breast just proximal to acerola. No discharge from nipples. Tale of Mliss Sax is unremarkable. .    Musculoskeletal: She exhibits tenderness.  See note under pulm/chest wall and gyn  Lymphadenopathy:    She has no cervical adenopathy.  Neurological: She is alert and oriented to person, place, and time.  Skin: Skin is warm and dry. No rash noted. No erythema.  Psychiatric: She has a normal mood and affect.          Assessment & Plan:

## 2014-06-11 ENCOUNTER — Telehealth: Payer: Self-pay

## 2014-06-11 ENCOUNTER — Telehealth: Payer: Self-pay | Admitting: Family

## 2014-06-11 DIAGNOSIS — N644 Mastodynia: Secondary | ICD-10-CM

## 2014-06-11 LAB — HM MAMMOGRAPHY: HM MAMMO: NEGATIVE

## 2014-06-11 NOTE — Telephone Encounter (Signed)
emmi emailed °

## 2014-06-11 NOTE — Telephone Encounter (Signed)
Reordered for [facility]

## 2014-06-12 ENCOUNTER — Telehealth: Payer: Self-pay

## 2014-06-25 ENCOUNTER — Encounter: Payer: Self-pay | Admitting: Family

## 2014-06-25 NOTE — Telephone Encounter (Signed)
Opened in error

## 2015-02-13 IMAGING — CR DG FOOT COMPLETE 3+V*R*
3 series · 3 of 3 positions shown · non-contrast
Comparison: None.

CLINICAL DATA: Fall with right foot pain.

RIGHT FOOT COMPLETE - 3+ VIEW

[view not recorded (1 of 3)]
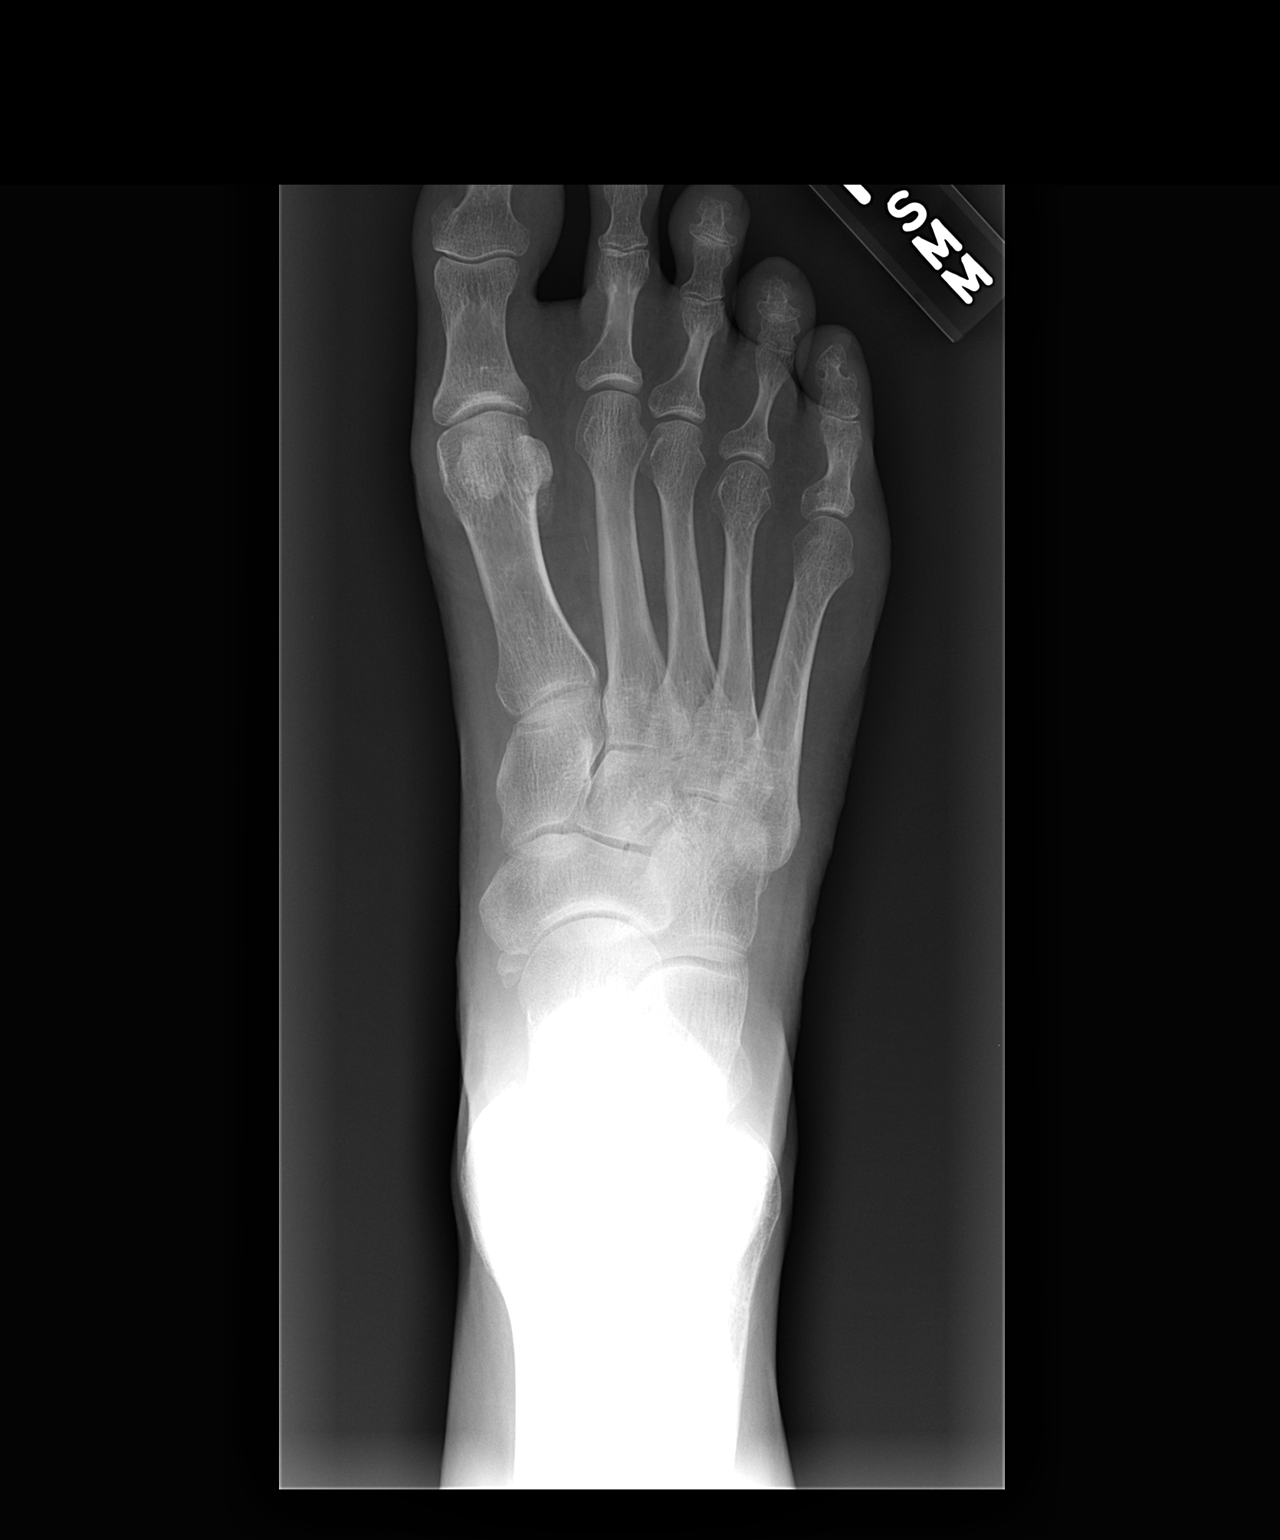

[view not recorded (2 of 3)]
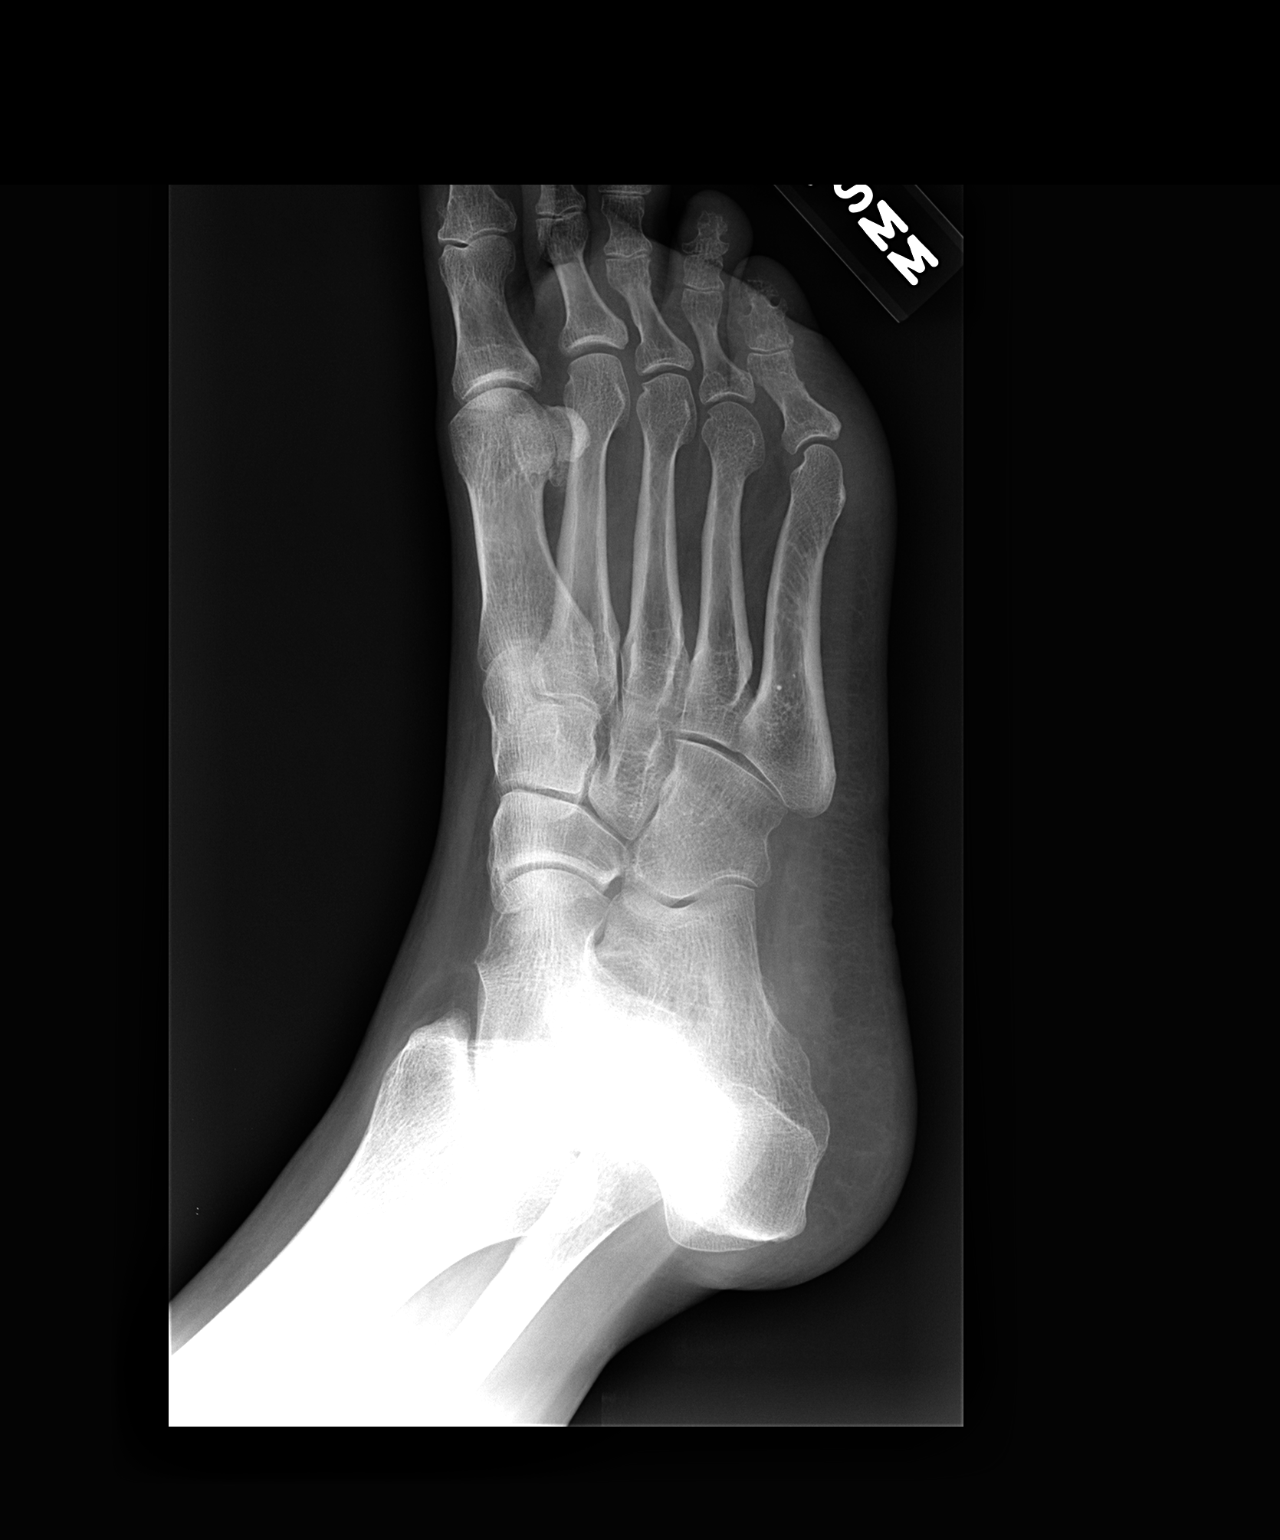

[view not recorded (3 of 3)]
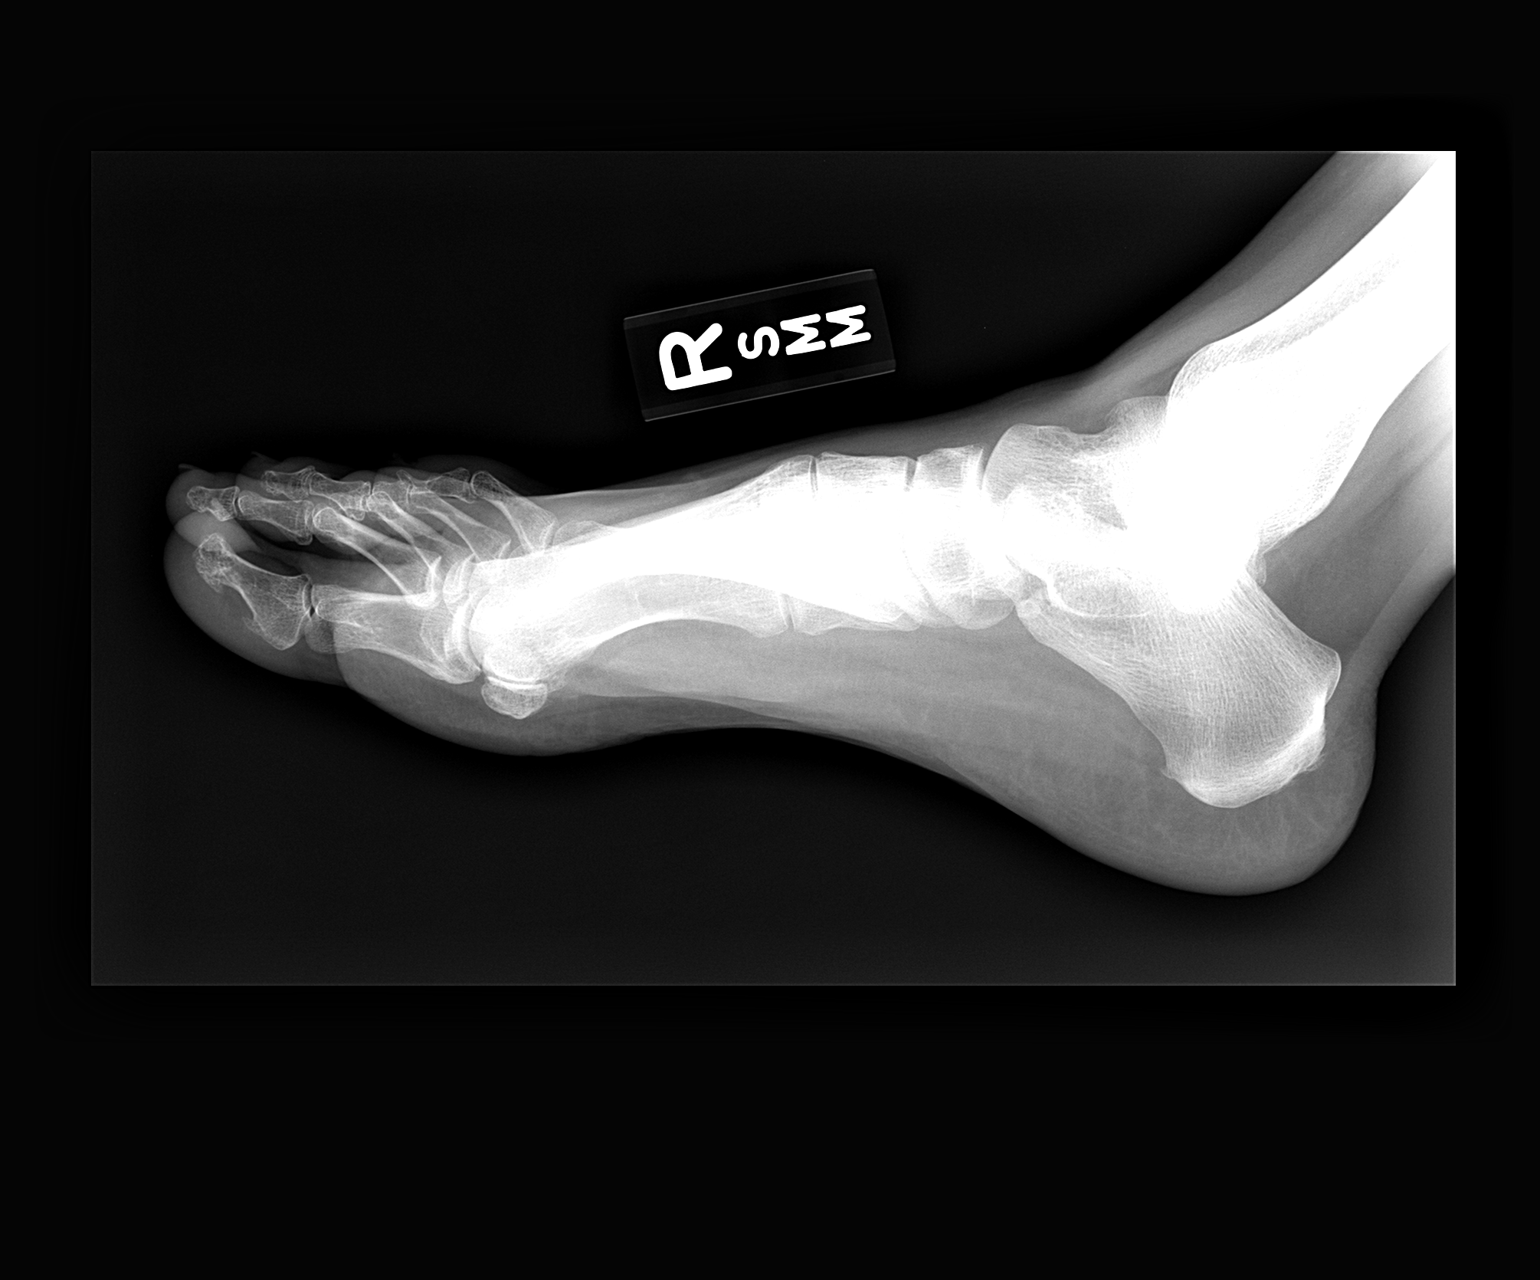

[3 of 3 positions shown; findings below may reference images not displayed]

FINDINGS: There is no evidence of fracture or dislocation.  There
is no evidence of arthropathy or other focal bone abnormality.
Soft tissues are unremarkable.
IMPRESSION: Negative.

## 2017-06-22 ENCOUNTER — Ambulatory Visit
Admission: RE | Admit: 2017-06-22 | Discharge: 2017-06-22 | Disposition: A | Discharge status: Home / Self Care | Payer: Worker's Compensation | Source: Ambulatory Visit | Attending: Emergency Medicine | Admitting: Emergency Medicine

## 2017-06-22 ENCOUNTER — Other Ambulatory Visit (HOSPITAL_COMMUNITY): Payer: Self-pay | Admitting: Emergency Medicine

## 2017-06-22 DIAGNOSIS — W19XXXA Unspecified fall, initial encounter: Secondary | ICD-10-CM

## 2021-07-08 DIAGNOSIS — R1310 Dysphagia, unspecified: Secondary | ICD-10-CM | POA: Diagnosis not present

## 2021-07-08 DIAGNOSIS — H903 Sensorineural hearing loss, bilateral: Secondary | ICD-10-CM | POA: Diagnosis not present

## 2021-07-08 DIAGNOSIS — H93293 Other abnormal auditory perceptions, bilateral: Secondary | ICD-10-CM | POA: Diagnosis not present

## 2021-07-08 DIAGNOSIS — H9313 Tinnitus, bilateral: Secondary | ICD-10-CM | POA: Diagnosis not present

## 2021-07-20 DIAGNOSIS — R1312 Dysphagia, oropharyngeal phase: Secondary | ICD-10-CM | POA: Diagnosis not present

## 2022-01-23 DIAGNOSIS — R21 Rash and other nonspecific skin eruption: Secondary | ICD-10-CM | POA: Diagnosis not present

## 2022-01-23 DIAGNOSIS — G8929 Other chronic pain: Secondary | ICD-10-CM | POA: Diagnosis not present

## 2022-01-23 DIAGNOSIS — M542 Cervicalgia: Secondary | ICD-10-CM | POA: Diagnosis not present

## 2022-05-22 DIAGNOSIS — R0602 Shortness of breath: Secondary | ICD-10-CM | POA: Diagnosis not present

## 2022-05-22 DIAGNOSIS — R634 Abnormal weight loss: Secondary | ICD-10-CM | POA: Diagnosis not present

## 2022-05-22 DIAGNOSIS — D7589 Other specified diseases of blood and blood-forming organs: Secondary | ICD-10-CM | POA: Diagnosis not present

## 2022-05-23 ENCOUNTER — Encounter: Payer: Self-pay | Admitting: Family Medicine

## 2022-05-23 DIAGNOSIS — R0602 Shortness of breath: Secondary | ICD-10-CM

## 2022-07-04 ENCOUNTER — Other Ambulatory Visit: Payer: Self-pay | Admitting: Family Medicine

## 2022-07-04 DIAGNOSIS — R0602 Shortness of breath: Secondary | ICD-10-CM

## 2022-07-25 ENCOUNTER — Ambulatory Visit
Admission: RE | Admit: 2022-07-25 | Discharge: 2022-07-25 | Disposition: A | Discharge status: Home / Self Care | Payer: BC Managed Care – PPO | Source: Ambulatory Visit | Attending: Family Medicine | Admitting: Family Medicine

## 2022-07-25 DIAGNOSIS — R918 Other nonspecific abnormal finding of lung field: Secondary | ICD-10-CM | POA: Diagnosis not present

## 2022-07-25 DIAGNOSIS — R911 Solitary pulmonary nodule: Secondary | ICD-10-CM | POA: Diagnosis not present

## 2022-07-25 DIAGNOSIS — I700 Atherosclerosis of aorta: Secondary | ICD-10-CM | POA: Diagnosis not present

## 2022-07-25 DIAGNOSIS — R0602 Shortness of breath: Secondary | ICD-10-CM

## 2023-02-04 ENCOUNTER — Encounter (HOSPITAL_COMMUNITY): Payer: Self-pay

## 2023-02-04 ENCOUNTER — Emergency Department (HOSPITAL_COMMUNITY)
Admission: EM | Admit: 2023-02-04 | Discharge: 2023-02-04 | Disposition: A | Discharge status: Home / Self Care | Payer: BC Managed Care – PPO | Attending: Emergency Medicine | Admitting: Emergency Medicine

## 2023-02-04 ENCOUNTER — Emergency Department (HOSPITAL_COMMUNITY): Payer: BC Managed Care – PPO

## 2023-02-04 DIAGNOSIS — Y92009 Unspecified place in unspecified non-institutional (private) residence as the place of occurrence of the external cause: Secondary | ICD-10-CM | POA: Insufficient documentation

## 2023-02-04 DIAGNOSIS — R0781 Pleurodynia: Secondary | ICD-10-CM | POA: Diagnosis not present

## 2023-02-04 DIAGNOSIS — S20211A Contusion of right front wall of thorax, initial encounter: Secondary | ICD-10-CM | POA: Diagnosis not present

## 2023-02-04 DIAGNOSIS — W1830XA Fall on same level, unspecified, initial encounter: Secondary | ICD-10-CM | POA: Diagnosis not present

## 2023-02-04 MED ORDER — METHOCARBAMOL 500 MG PO TABS
500.0000 mg | ORAL_TABLET | Freq: Once | ORAL | Status: AC
  Administered 2023-02-04: 500 mg via ORAL
  Filled 2023-02-04: qty 1

## 2023-02-04 MED ORDER — METHOCARBAMOL 500 MG PO TABS: 500.0000 mg | ORAL_TABLET | Freq: Two times a day (BID) | ORAL | 0 refills | Status: AC

## 2023-02-04 NOTE — Discharge Instructions (Addendum)
Discussed, you do not have any rib fractures.  You can ice the ribs and take Robaxin as needed.  I sent a prescription to your pharmacy.   Follow-up with your PCP in 5 days for reevaluation of your symptoms.  Return to the ED if your pain worsens in the interim.

## 2023-02-04 NOTE — ED Provider Notes (Signed)
[place] EMERGENCY DEPARTMENT AT [facility] Provider Note   CSN: [id number] Arrival date & time: 02/04/23  1520     History  Chief Complaint  Patient presents with   Rib Injury    Kimberly Mcneil is a 57 y.o. female with a history of tobacco abuse and anxiety presents the ED today for rib pain.  Patient states that she fell at home 2 days ago and landed on her right ribs.  Patient reports persistent pain since onset, worsening with deep breathing, sneezing, and coughing.  No shortness of breath or chest pain. She denies hitting her head or loss of consciousness.  Patient is not on any blood thinners.  She has not tried any OTC analgesics for the pain. Additionally, patient reports pain to the left arm after a fall about a month ago.  She states at that time she tripped over her dog's bed landed on her left arm a lot of reports.  Patient maintains full range of motion of her left upper extremity with no apparent bruising.  No additional complaints or concerns at this time.    Home Medications Prior to Admission medications   Medication Sig Start Date End Date Taking? Authorizing Provider  methocarbamol (ROBAXIN) 500 MG tablet Take 1 tablet (500 mg total) by mouth 2 (two) times daily for 7 days. 02/04/23 02/11/23 Yes Maxwell Marion, PA-C  benzonatate (TESSALON PERLES) 100 MG capsule Take 1 capsule (100 mg total) by mouth 3 (three) times daily as needed for cough. 06/10/14   Doreene Nest, NP  Calcium Carbonate-Vitamin D (CALCIUM-D) 600-400 MG-UNIT TABS Take 1 tablet by mouth 2 (two) times daily.    [provider]      Allergies    Patient has no known allergies.    Review of Systems   Review of Systems  Musculoskeletal:        Right rib pain  All other systems reviewed and are negative.   Physical Exam Updated Vital Signs BP (!) 175/100 (BP Location: Right Arm)   Pulse 97   Temp 98.2 F (36.8 C) (Oral)   Resp 18   SpO2 100%  Physical Exam Vitals and  nursing note reviewed.  Constitutional:      General: She is not in acute distress.    Appearance: Normal appearance.  HENT:     Head: Normocephalic and atraumatic.     Mouth/Throat:     Mouth: Mucous membranes are moist.  Eyes:     Conjunctiva/sclera: Conjunctivae normal.     Pupils: Pupils are equal, round, and reactive to light.  Cardiovascular:     Rate and Rhythm: Normal rate and regular rhythm.     Pulses: Normal pulses.     Heart sounds: Normal heart sounds.  Pulmonary:     Effort: Pulmonary effort is normal.     Breath sounds: Normal breath sounds.  Musculoskeletal:        General: Tenderness present.     Comments: Tenderness to palpation of right posterior ribs without bruising present. No midline tenderness. Strength of upper and lower extremities intact  Skin:    General: Skin is warm and dry.     Findings: No rash.  Neurological:     General: No focal deficit present.     Mental Status: She is alert.     Sensory: No sensory deficit.     Motor: No weakness.  Psychiatric:        Mood and Affect: Mood normal.  Behavior: Behavior normal.    ED Results / Procedures / Treatments   Labs (all labs ordered are listed, but only abnormal results are displayed) Labs Reviewed - No data to display  EKG None  Radiology DG Ribs Unilateral W/Chest Right  Result Date: 02/04/2023 CLINICAL DATA:  Rule out fracture. Complains of right-sided rib cage pain. Status post fall. EXAM: RIGHT RIBS AND CHEST - 3+ VIEW COMPARISON:  CT chest 07/25/2022 FINDINGS: No fracture or other bone lesions are seen involving the ribs. There is no evidence of pneumothorax or pleural effusion. Both lungs are clear. Heart size and mediastinal contours are within normal limits. IMPRESSION: Negative. Electronically Signed   By: Signa Kell M.D.   On: 02/04/2023 16:38    Procedures Procedures: not indicated.   Medications Ordered in ED Medications  methocarbamol (ROBAXIN) tablet 500 mg (500  mg Oral Provided for home use 02/04/23 1608)    ED Course/ Medical Decision Making/ A&P                                 Medical Decision Making Amount and/or Complexity of Data Reviewed Radiology: ordered.  Risk Prescription drug management.   This patient presents to the ED for concern of rib pain, this involves an extensive number of treatment options, and is a complaint that carries with it a high risk of complications and morbidity.   Differential diagnosis includes: Fracture, dislocation, contusion, muscle strain, etc.   Comorbidities  See HPI above   Additional History  Additional history obtained from prior records.   Imaging Studies  I ordered imaging studies including right rib x-ray with chest I independently visualized and interpreted imaging which showed: no acute fractures. I agree with the radiologist interpretation   Problem List / ED Course / Critical Interventions / Medication Management  Right rib pain I ordered medications including: Robaxin for pain  Reevaluation of the patient after these medicines showed that the patient improved I have reviewed the patients home medicines and have made adjustments as needed   Social Determinants of Health  Physical activity   Test / Admission - Considered  Discussed findings with patient.  All questions were answered. She is hemodynamically stable and safe for discharge home. Return precautions provided.       Final Clinical Impression(s) / ED Diagnoses Final diagnoses:  Rib contusion, right, initial encounter    Rx / DC Orders ED Discharge Orders          Ordered    methocarbamol (ROBAXIN) 500 MG tablet  2 times daily        02/04/23 1650              Maxwell Marion, PA-C 02/04/23 1650    Gloris Manchester, MD 02/04/23 2352

## 2023-02-04 NOTE — ED Triage Notes (Signed)
Pt presents with c/o right side rib cage pain. Pt reports she fell onto her ribs on Friday night. Pt reports pain when taking a deep breath or coughing.

## 2023-02-12 DIAGNOSIS — S20211A Contusion of right front wall of thorax, initial encounter: Secondary | ICD-10-CM | POA: Diagnosis not present

## 2023-02-22 DIAGNOSIS — S20211A Contusion of right front wall of thorax, initial encounter: Secondary | ICD-10-CM | POA: Diagnosis not present
